# Patient Record
Sex: Male | Born: 1960 | Race: White | Hispanic: No | Marital: Married | Smoking: Never smoker
Health system: Southern US, Community
[De-identification: ages and names within clinical notes are randomized; demographics above are authoritative.]

## PROBLEM LIST (undated history)

## (undated) DIAGNOSIS — E785 Hyperlipidemia, unspecified: Secondary | ICD-10-CM

## (undated) DIAGNOSIS — I10 Essential (primary) hypertension: Secondary | ICD-10-CM

## (undated) HISTORY — DX: Essential (primary) hypertension: I10

## (undated) HISTORY — DX: Hyperlipidemia, unspecified: E78.5

## (undated) HISTORY — PX: FOOT FRACTURE SURGERY: SHX645

---

## 2014-05-09 ENCOUNTER — Emergency Department (HOSPITAL_COMMUNITY): Payer: 59

## 2014-05-09 ENCOUNTER — Emergency Department (HOSPITAL_COMMUNITY)
Admission: EM | Admit: 2014-05-09 | Discharge: 2014-05-09 | Disposition: A | Discharge status: Home / Self Care | Payer: 59 | Attending: Emergency Medicine | Admitting: Emergency Medicine

## 2014-05-09 ENCOUNTER — Encounter (HOSPITAL_COMMUNITY): Payer: Self-pay | Admitting: Emergency Medicine

## 2014-05-09 DIAGNOSIS — Y9389 Activity, other specified: Secondary | ICD-10-CM | POA: Insufficient documentation

## 2014-05-09 DIAGNOSIS — Y92410 Unspecified street and highway as the place of occurrence of the external cause: Secondary | ICD-10-CM | POA: Insufficient documentation

## 2014-05-09 DIAGNOSIS — S99929A Unspecified injury of unspecified foot, initial encounter: Secondary | ICD-10-CM

## 2014-05-09 DIAGNOSIS — S8990XA Unspecified injury of unspecified lower leg, initial encounter: Secondary | ICD-10-CM | POA: Insufficient documentation

## 2014-05-09 DIAGNOSIS — S52022A Displaced fracture of olecranon process without intraarticular extension of left ulna, initial encounter for closed fracture: Secondary | ICD-10-CM

## 2014-05-09 DIAGNOSIS — S52023A Displaced fracture of olecranon process without intraarticular extension of unspecified ulna, initial encounter for closed fracture: Secondary | ICD-10-CM | POA: Insufficient documentation

## 2014-05-09 DIAGNOSIS — S99919A Unspecified injury of unspecified ankle, initial encounter: Secondary | ICD-10-CM

## 2014-05-09 MED ORDER — ONDANSETRON HCL 4 MG/2ML IJ SOLN
4.0000 mg | Freq: Once | INTRAMUSCULAR | Status: AC
  Administered 2014-05-09: 4 mg via INTRAVENOUS
  Filled 2014-05-09: qty 2

## 2014-05-09 MED ORDER — HYDROMORPHONE HCL PF 1 MG/ML IJ SOLN
1.0000 mg | Freq: Once | INTRAMUSCULAR | Status: AC
  Administered 2014-05-09: 1 mg via INTRAVENOUS
  Filled 2014-05-09: qty 1

## 2014-05-09 MED ORDER — OXYCODONE-ACETAMINOPHEN 5-325 MG PO TABS: 2.0000 | ORAL_TABLET | Freq: Four times a day (QID) | ORAL | Status: DC | PRN
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
## 2014-05-09 NOTE — ED Notes (Signed)
Cleaned wounds with normal saline.  Dry non stick dressings applied where needed.

## 2014-05-09 NOTE — Discharge Instructions (Signed)
Take motrin 800 mg every 6 hrs for pain.   For severe pain, take percocet as prescribed. Do NOT drive.  Wear splint at all times.   Follow up with orthopedic doctor in a week for repeat xray.   Return to ER if you have severe pain, fingers turning blue.

## 2014-05-09 NOTE — Progress Notes (Signed)
Orthopedic Tech Progress Note Patient Details:  Ramello Cordial [birth year] [id number] Applied fiberglass posterior arm splint to LUE.  Pulses, sensation, motion intact before and after splinting.  Capillary refill less than 2 seconds before and after splinting.  Applied sling to LUE. Ortho Devices Type of Ortho Device: Arm sling;Post (short arm) splint Ortho Device/Splint Interventions: Application   Darrol Poke 05/09/2014, 9:00 AM

## 2014-05-09 NOTE — ED Notes (Signed)
Bed: WA13 Expected date:  Expected time:  Means of arrival:  Comments: EMS 

## 2014-05-16 ENCOUNTER — Ambulatory Visit
Admission: RE | Admit: 2014-05-16 | Discharge: 2014-05-16 | Disposition: A | Discharge status: Home / Self Care | Payer: 59 | Source: Ambulatory Visit | Attending: Orthopedic Surgery | Admitting: Orthopedic Surgery

## 2014-05-16 ENCOUNTER — Other Ambulatory Visit: Payer: Self-pay | Admitting: Orthopedic Surgery

## 2014-05-16 DIAGNOSIS — S52022A Displaced fracture of olecranon process without intraarticular extension of left ulna, initial encounter for closed fracture: Secondary | ICD-10-CM

## 2016-12-11 DIAGNOSIS — B029 Zoster without complications: Secondary | ICD-10-CM | POA: Diagnosis not present

## 2016-12-11 DIAGNOSIS — H9202 Otalgia, left ear: Secondary | ICD-10-CM | POA: Diagnosis not present

## 2016-12-28 DIAGNOSIS — E7800 Pure hypercholesterolemia, unspecified: Secondary | ICD-10-CM | POA: Diagnosis not present

## 2016-12-28 DIAGNOSIS — Z0000 Encounter for general adult medical examination without abnormal findings: Secondary | ICD-10-CM | POA: Diagnosis not present

## 2017-01-28 DIAGNOSIS — H6123 Impacted cerumen, bilateral: Secondary | ICD-10-CM | POA: Diagnosis not present

## 2017-03-29 DIAGNOSIS — E7800 Pure hypercholesterolemia, unspecified: Secondary | ICD-10-CM | POA: Diagnosis not present

## 2017-04-20 DIAGNOSIS — L237 Allergic contact dermatitis due to plants, except food: Secondary | ICD-10-CM | POA: Diagnosis not present

## 2017-05-04 DIAGNOSIS — H6123 Impacted cerumen, bilateral: Secondary | ICD-10-CM | POA: Diagnosis not present

## 2017-09-21 DIAGNOSIS — H6123 Impacted cerumen, bilateral: Secondary | ICD-10-CM | POA: Diagnosis not present

## 2017-12-01 DIAGNOSIS — J329 Chronic sinusitis, unspecified: Secondary | ICD-10-CM | POA: Diagnosis not present

## 2017-12-27 DIAGNOSIS — H6123 Impacted cerumen, bilateral: Secondary | ICD-10-CM | POA: Diagnosis not present

## 2017-12-27 DIAGNOSIS — J0100 Acute maxillary sinusitis, unspecified: Secondary | ICD-10-CM | POA: Diagnosis not present

## 2017-12-27 DIAGNOSIS — J302 Other seasonal allergic rhinitis: Secondary | ICD-10-CM | POA: Diagnosis not present

## 2018-04-12 DIAGNOSIS — H6123 Impacted cerumen, bilateral: Secondary | ICD-10-CM | POA: Diagnosis not present

## 2018-07-05 DIAGNOSIS — I10 Essential (primary) hypertension: Secondary | ICD-10-CM | POA: Diagnosis not present

## 2018-09-15 DIAGNOSIS — H6123 Impacted cerumen, bilateral: Secondary | ICD-10-CM | POA: Diagnosis not present

## 2020-05-23 ENCOUNTER — Other Ambulatory Visit: Payer: Self-pay | Admitting: Internal Medicine

## 2020-05-23 DIAGNOSIS — H905 Unspecified sensorineural hearing loss: Secondary | ICD-10-CM

## 2020-05-23 DIAGNOSIS — H8123 Vestibular neuronitis, bilateral: Secondary | ICD-10-CM

## 2020-05-23 DIAGNOSIS — R2689 Other abnormalities of gait and mobility: Secondary | ICD-10-CM

## 2020-05-23 DIAGNOSIS — H9313 Tinnitus, bilateral: Secondary | ICD-10-CM

## 2020-06-07 ENCOUNTER — Other Ambulatory Visit: Payer: Self-pay

## 2020-06-25 ENCOUNTER — Ambulatory Visit
Admission: RE | Admit: 2020-06-25 | Discharge: 2020-06-25 | Disposition: A | Discharge status: Home / Self Care | Payer: No Typology Code available for payment source | Source: Ambulatory Visit | Attending: Internal Medicine | Admitting: Internal Medicine

## 2020-06-25 ENCOUNTER — Other Ambulatory Visit: Payer: Self-pay

## 2020-06-25 DIAGNOSIS — H9313 Tinnitus, bilateral: Secondary | ICD-10-CM

## 2020-06-25 DIAGNOSIS — H8123 Vestibular neuronitis, bilateral: Secondary | ICD-10-CM

## 2020-06-25 DIAGNOSIS — R2689 Other abnormalities of gait and mobility: Secondary | ICD-10-CM

## 2020-06-25 DIAGNOSIS — H905 Unspecified sensorineural hearing loss: Secondary | ICD-10-CM

## 2020-06-25 MED ORDER — GADOBENATE DIMEGLUMINE 529 MG/ML IV SOLN
15.0000 mL | Freq: Once | INTRAVENOUS | Status: AC | PRN
Start: 2020-06-25 — End: 2020-06-25
  Administered 2020-06-25: 15 mL via INTRAVENOUS

## 2020-09-24 ENCOUNTER — Ambulatory Visit: Payer: Self-pay | Admitting: Neurology

## 2021-01-02 ENCOUNTER — Other Ambulatory Visit: Payer: Self-pay | Admitting: Physician Assistant

## 2021-01-02 DIAGNOSIS — D329 Benign neoplasm of meninges, unspecified: Secondary | ICD-10-CM

## 2021-12-26 ENCOUNTER — Other Ambulatory Visit (HOSPITAL_COMMUNITY): Payer: Self-pay | Admitting: Family Medicine

## 2022-01-08 ENCOUNTER — Other Ambulatory Visit: Payer: Self-pay

## 2022-01-08 ENCOUNTER — Ambulatory Visit (HOSPITAL_COMMUNITY)
Admission: RE | Admit: 2022-01-08 | Discharge: 2022-01-08 | Disposition: A | Discharge status: Home / Self Care | Payer: Self-pay | Source: Ambulatory Visit | Attending: Family Medicine | Admitting: Family Medicine

## 2022-01-08 DIAGNOSIS — R911 Solitary pulmonary nodule: Secondary | ICD-10-CM | POA: Insufficient documentation

## 2022-01-08 DIAGNOSIS — Z136 Encounter for screening for cardiovascular disorders: Secondary | ICD-10-CM | POA: Insufficient documentation

## 2022-02-21 NOTE — Progress Notes (Signed)
?  ?Cardiology Office Note ? ? ?Date:  02/26/2022  ? ?ID:  Wesley Rosales, DOB 01-28-1961, MRN [id number] ? ?PCP:  Wesley Pepper, MD  ?Cardiologist:   Wesley Golob Martinique, MD  ? ?Chief Complaint  ?Patient presents with  ? Coronary Artery Disease  ? ? ?  ?History of Present Illness: ?Wesley Rosales is a 61 y.o. male who is seen at the request of Dr Wesley Rosales for evaluation of an abnormal coronary calcium score. His wife Wesley Rosales used to be my Education administrator. He works in Engineer, technical sales. States he is sedentary. Had coronary calcium score done recently for risk assessment. Had a brother with a coronary stent age 62. He has a history of HTN and HLD. Recently started on statin 3 weeks ago. He denies any chest pain. Does get SOB when running. Generally feels well.  ? ?Past Medical History:  ?Diagnosis Date  ? Hyperlipidemia   ? Hypertension   ? ? ?Past Surgical History:  ?Procedure Laterality Date  ? FOOT FRACTURE SURGERY    ? ? ? ?Current Outpatient Medications  ?Medication Sig Dispense Refill  ? aspirin EC 81 MG tablet Take 1 tablet (81 mg total) by mouth daily. Swallow whole. 90 tablet 3  ? atorvastatin (LIPITOR) 10 MG tablet Take 10 mg by mouth daily.    ? FLUoxetine (PROZAC) 20 MG tablet Take 20 mg by mouth daily.    ? fluticasone (FLONASE) 50 MCG/ACT nasal spray 1 spray in each nostril    ? ibuprofen (ADVIL,MOTRIN) 200 MG tablet Take 600 mg by mouth every 6 (six) hours as needed for moderate pain.    ? lisinopril (ZESTRIL) 10 MG tablet Take 1 tablet by mouth daily.    ? ?No current facility-administered medications for this visit.  ? ? ?Allergies:   Patient has no known allergies.  ? ? ?Social History:  The patient  reports that he has never smoked. He does not have any smokeless tobacco history on file. He reports current alcohol use. He reports that he does not use drugs.  ? ?Family History:  The patient's family history includes COPD in his father; Heart disease in his brother; Lung cancer in his mother; Stroke in his mother.  ? ? ?ROS:   Please see the history of present illness.   Otherwise, review of systems are positive for none.   All other systems are reviewed and negative.  ? ? ?PHYSICAL EXAM: ?VS:  BP 132/86   Pulse 67   Ht 6' (1.829 m)   Wt 202 lb 12.8 oz (92 kg)   SpO2 98%   BMI 27.50 kg/m?  , BMI Body mass index is 27.5 kg/m?. ?GEN: Well nourished, well developed, in no acute distress ?HEENT: normal ?Neck: no JVD, carotid bruits, or masses ?Cardiac: RRR; no murmurs, rubs, or gallops,no edema  ?Respiratory:  clear to auscultation bilaterally, normal work of breathing ?GI: soft, nontender, nondistended, + BS ?MS: no deformity or atrophy ?Skin: warm and dry, no rash ?Neuro:  Strength and sensation are intact ?Psych: euthymic mood, full affect ? ? ?EKG:  EKG is ordered today. ?The ekg ordered today demonstrates NSR rate 67. Normal Ecg. I have personally reviewed and interpreted this study. ? ? ? ?Recent Labs: ?No results found for requested labs within last 8760 hours.  ? ?Dated 04/21/19: cholesterol 192, triglycerides 162, HDL 37, LDL 123.  ?Dated 05/02/20: Normal CBC, CMET, TSH. ? ?Lipid Panel ?No results found for: CHOL, TRIG, HDL, CHOLHDL, VLDL, LDLCALC, LDLDIRECT ?  ? ?Wt Readings  from Last 3 Encounters:  ?02/26/22 202 lb 12.8 oz (92 kg)  ?  ? ? ?Other studies Reviewed: ?Additional studies/ records that were reviewed today include:  ? ?ADDENDUM REPORT: 01/08/2022 16:22 ?  ?EXAM: ?OVER-READ INTERPRETATION  CT CHEST ?  ?The following report is an over-read performed by radiologist Dr. ?[facility] on 01/08/2022. This over-read ?does not include interpretation of cardiac or coronary anatomy or ?pathology. The coronary calcium score interpretation by the ?cardiologist is attached. ?  ?COMPARISON:  None. ?  ?FINDINGS: ?Visualized mediastinal structures are normal. Images of the upper ?abdomen are unremarkable. 3 mm nodule in the right middle lobe on ?sequence 5 image 24. No large areas of consolidation or  airspace ?disease in the visualized lungs. No large pleural effusions. No ?acute bone abnormality. ?  ?IMPRESSION: ?1. No acute extracardiac findings. ?2. 3 mm nodule in the right middle lobe. No follow-up needed if ?patient is low-risk (and has no known or suspected primary ?neoplasm). Non-contrast chest CT can be considered in 12 months if ?patient is high-risk. This recommendation follows the consensus ?statement: Guidelines for Management of Incidental Pulmonary Nodules ?Detected on CT Images: From the Fleischner Society 2017; Radiology ?2017; 093:[id number]. ?  ?  ?Electronically Signed ?  By: Markus Daft M.D. ?  On: 01/08/2022 16:22 ?   ?Addended by Markus Daft, MD on 01/08/2022  4:25 PM  ? ?Study Result ? ?Narrative & Impression  ?CLINICAL DATA:  Cardiovascular Disease Risk stratification ?  ?EXAM: ?Coronary Calcium Score ?  ?TECHNIQUE: ?A gated, non-contrast computed tomography scan of the heart was ?performed using 30m slice thickness. Axial images were analyzed on a ?dedicated workstation. Calcium scoring of the coronary arteries was ?performed using the Agatston method. ?  ?FINDINGS: ?Coronary arteries: Normal origins. ?  ?Coronary Calcium Score: ?  ?Left main: 0 ?  ?Left anterior descending artery: 16.6 ?  ?Left circumflex artery: 0 ?  ?Right coronary artery: 228 ?  ?Total: 244 ?  ?Percentile: 85th ?  ?Pericardium: Normal. ?  ?Ascending Aorta: Normal caliber. ?  ?[facility]card[facility]: [facility] ?  ?IMPRESSION: ?Coronary calcium score of 244. This was 85th percentile for age-, ?race-, and sex-matched controls.  ? ? ? ?ASSESSMENT AND PLAN: ? ?1.  Coronary artery disease with elevated coronary calcium score. Only symptom may be DOE but this is more likely related to deconditioning. Discussed lifestyle modification moving to more of a plant based/Mediterranean style diet. Needs regular aerobic exercise 30-45 minutes per day. Recommend he take ASA 81 mg daily. Continue to focus on BP and  lipid control. Will arrange for ETT to make sure he doesn't have high risk anatomy ?2. Hypercholesterolemia. Goal LDL < 70. To have follow up lab with PCP  ?3. HTN on lisinopril ?4. Family history of CAD ? ? ?Current medicines are reviewed at length with the patient today.  The patient does not have concerns regarding medicines. ? ?The following changes have been made:  add ASA 81 mg daily ? ?Labs/ tests ordered today include:  ? ?Orders Placed This Encounter  ?Procedures  ? Cardiac Stress Test: Informed Consent Details: Physician/Practitioner Attestation; Transcribe to consent form and obtain patient signature  ? EKG 12-Lead  ? ? ?   ? ? ?Disposition:   FU with me in 1 year providing ETT is OK ? ?Signed, ?Saber Dickerman JMartinique MD  ?02/26/2022 9:03 AM    ?[place] [place]?[address] [place] [address]?Phone [phone] Fax  [phone] ? ? ?

## 2022-02-26 ENCOUNTER — Encounter: Payer: Self-pay | Admitting: Cardiology

## 2022-02-26 ENCOUNTER — Ambulatory Visit (INDEPENDENT_AMBULATORY_CARE_PROVIDER_SITE_OTHER): Payer: No Typology Code available for payment source | Admitting: Cardiology

## 2022-02-26 VITALS — BP 132/86 | HR 67 | Ht 72.0 in | Wt 202.8 lb

## 2022-02-26 DIAGNOSIS — E7800 Pure hypercholesterolemia, unspecified: Secondary | ICD-10-CM

## 2022-02-26 DIAGNOSIS — I10 Essential (primary) hypertension: Secondary | ICD-10-CM | POA: Diagnosis not present

## 2022-02-26 DIAGNOSIS — I2510 Atherosclerotic heart disease of native coronary artery without angina pectoris: Secondary | ICD-10-CM

## 2022-02-26 MED ORDER — ASPIRIN EC 81 MG PO TBEC: 81.0000 mg | DELAYED_RELEASE_TABLET | Freq: Every day | ORAL | 3 refills | Status: AC

## 2022-02-26 NOTE — Patient Instructions (Signed)
Medication Instructions:  ?Continue same medications ? ? ?Lab Work: ?None ordered ? ? ?Testing/Procedures: ?Treadmill  ( POET ) ? ? ?Follow-Up: ?At [facility], you and your health needs are our priority.  As part of our continuing mission to provide you with exceptional heart care, we have created designated Provider Care Teams.  These Care Teams include your primary Cardiologist (physician) and Advanced Practice Providers (APPs -  Physician Assistants and Nurse Practitioners) who all work together to provide you with the care you need, when you need it. ? ?We recommend signing up for the patient portal called "MyChart".  Sign up information is provided on this After Visit Summary.  MyChart is used to connect with patients for Virtual Visits (Telemedicine).  Patients are able to view lab/test results, encounter notes, upcoming appointments, etc.  Non-urgent messages can be sent to your provider as well.   ?To learn more about what you can do with MyChart, go to [email or web address].   ? ?Your next appointment:  1 year   Call in Jan to schedule April appointment ?  ? ?The format for your next appointment: Office ? ? ?Provider:  Dr.Jordan ? ? ?Important Information About Sugar ? ? ? ? ? ? ?

## 2022-03-05 ENCOUNTER — Telehealth (HOSPITAL_COMMUNITY): Payer: Self-pay | Admitting: *Deleted

## 2022-03-05 NOTE — Telephone Encounter (Signed)
Close encounter 

## 2022-03-06 ENCOUNTER — Ambulatory Visit (HOSPITAL_COMMUNITY)
Admission: RE | Admit: 2022-03-06 | Discharge: 2022-03-06 | Disposition: A | Discharge status: Home / Self Care | Payer: No Typology Code available for payment source | Source: Ambulatory Visit | Attending: Cardiology | Admitting: Cardiology

## 2022-03-06 DIAGNOSIS — I10 Essential (primary) hypertension: Secondary | ICD-10-CM

## 2022-03-06 DIAGNOSIS — E7800 Pure hypercholesterolemia, unspecified: Secondary | ICD-10-CM

## 2022-03-06 DIAGNOSIS — I2510 Atherosclerotic heart disease of native coronary artery without angina pectoris: Secondary | ICD-10-CM | POA: Diagnosis present

## 2022-03-06 LAB — EXERCISE TOLERANCE TEST
Angina Index: 0
Duke Treadmill Score: 9
Estimated workload: 10.2
Exercise duration (min): 9 min
Exercise duration (sec): 12 s
MPHR: 162 {beats}/min
Peak HR: 146 {beats}/min
Percent HR: 90 %
Rest HR: 60 {beats}/min
ST Depression (mm): 0 mm

## 2023-12-16 IMAGING — CT CT CARDIAC CORONARY ARTERY CALCIUM SCORE
2 series · 14 of 20 positions shown, 16 images · non-contrast
Comparison: None.

Addendum:
CLINICAL DATA: Cardiovascular Disease Risk stratification

EXAM:
Coronary Calcium Score
TECHNIQUE: A gated, non-contrast computed tomography scan of the heart was
performed using 3mm slice thickness. Axial images were analyzed on a
dedicated workstation. Calcium scoring of the coronary arteries was
performed using the Agatston method.

[Series 3: cascseq 2.0 b35f 70% · axial · 0.36mm/px · z∈[+1260,+1336]mm · 6 of 69 slices shown]
[im 8/69  vessel]
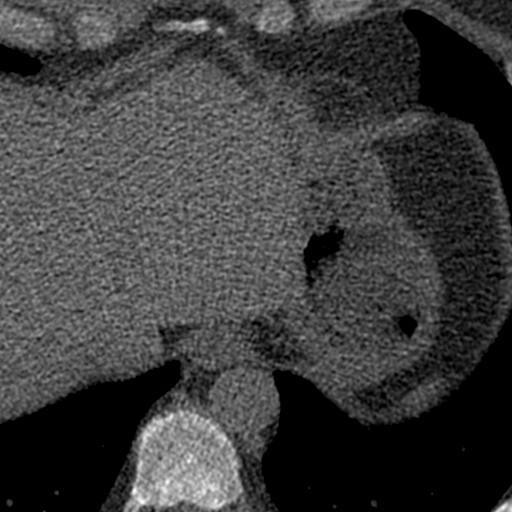
[im 16/69  vessel]
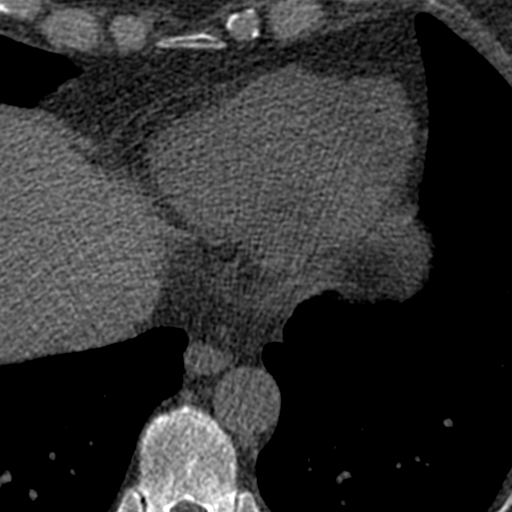
[im 23/69  vessel]
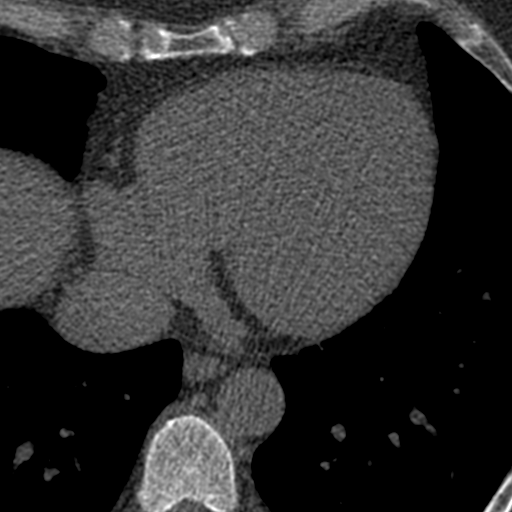
[im 31/69  vessel]
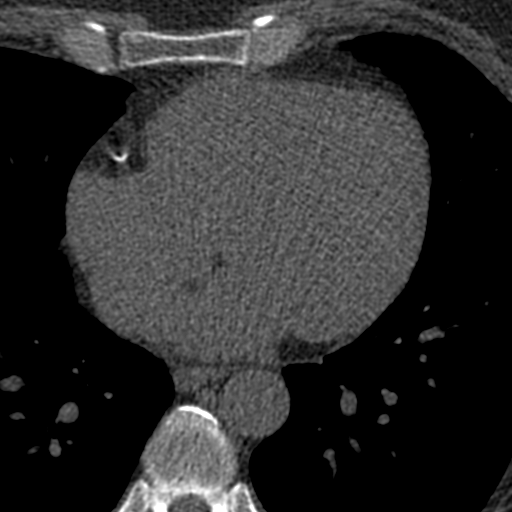
[im 38/69  vessel]
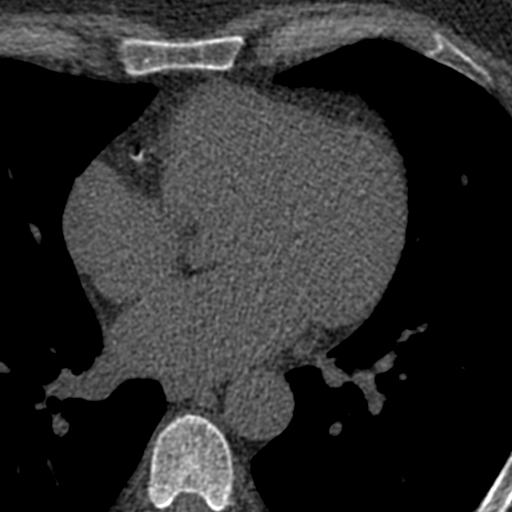
[im 46/69  vessel]
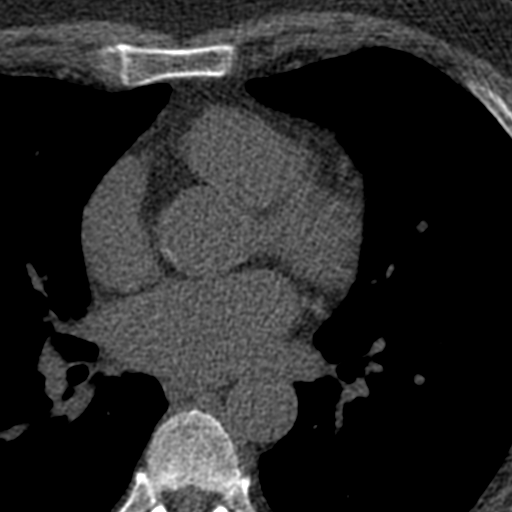

[Series 4: ax st full fov · axial · 0.87mm/px · z∈[+1260,+1366]mm · 8 of 69 slices shown, 10 images]
[im 8/69  vessel]
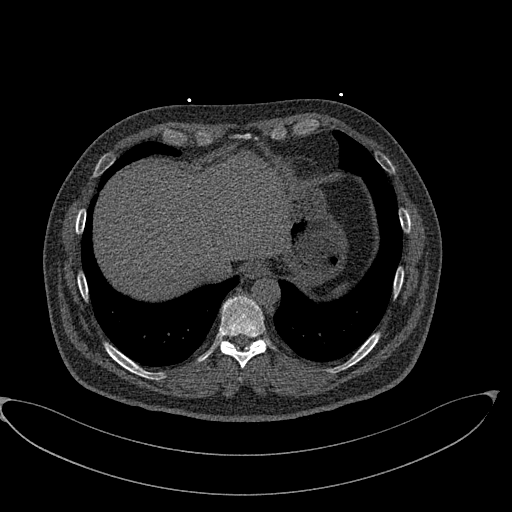
[im 8/69  lung]
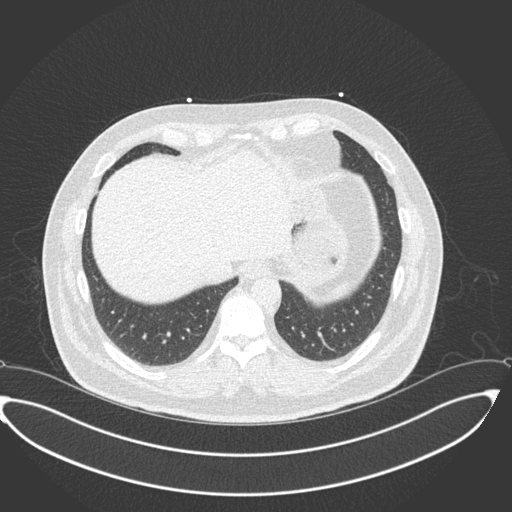
[im 16/69  vessel]
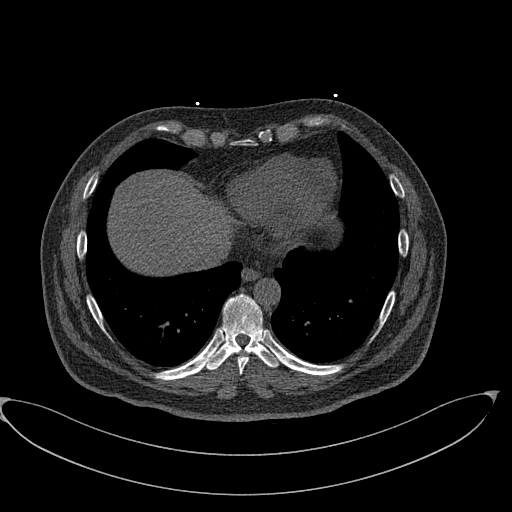
[im 23/69  vessel]
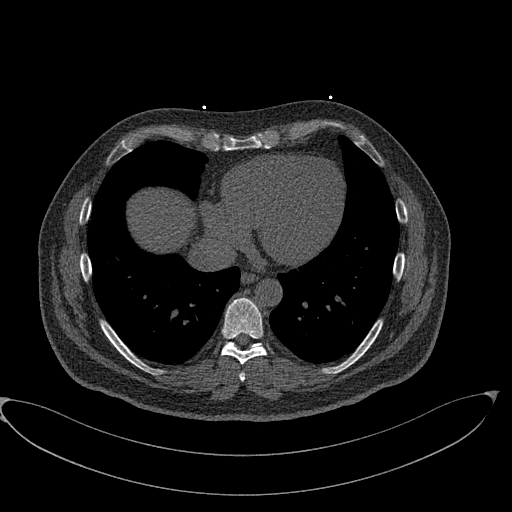
[im 31/69  vessel]
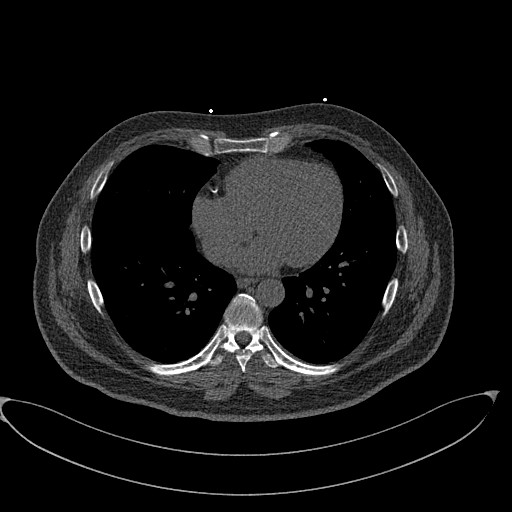
[im 38/69  vessel]
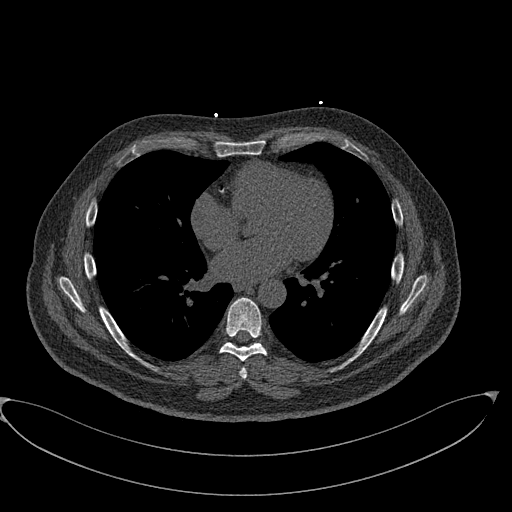
[im 38/69  lung]
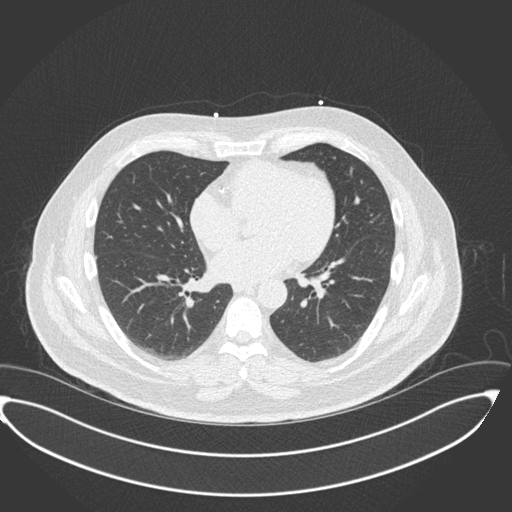
[im 46/69  vessel]
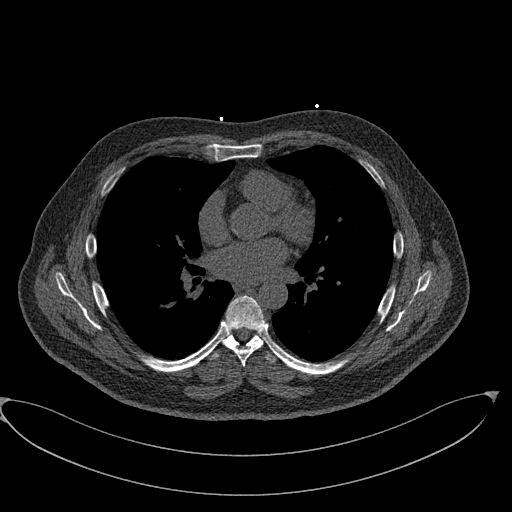
[im 53/69  vessel]
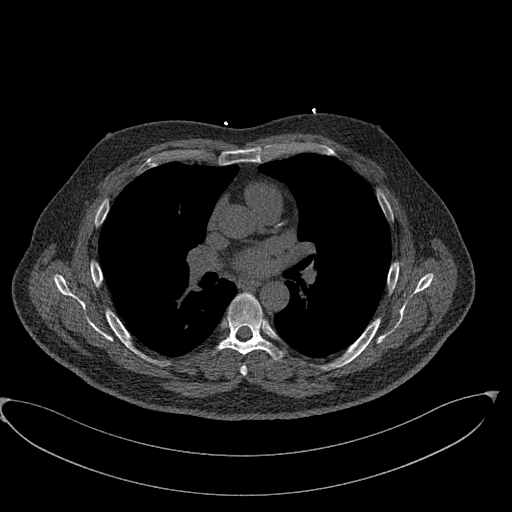
[im 61/69  vessel]
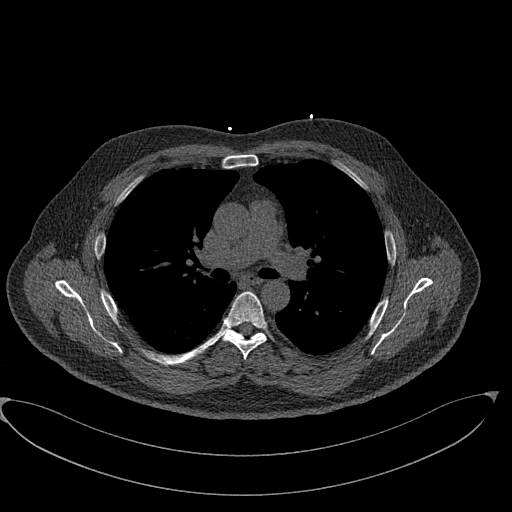

[14 of 20 positions shown; findings below may reference images not displayed]

FINDINGS: Coronary arteries: Normal origins.

Coronary Calcium Score:

Left main: 0

Left anterior descending artery:

Left circumflex artery: 0

Right coronary artery: 228

Total: 244

Percentile: 85th

Pericardium: Normal.

Ascending Aorta: Normal caliber.

Non-cardiac: See separate report from [REDACTED].
IMPRESSION: Coronary calcium score of 244. This was 85th percentile for age-,
race-, and sex-matched controls.



If CAC=0, it is reasonable to withhold statin therapy and reassess
in 5 to 10 years, as long as higher risk conditions are absent
(diabetes mellitus, family history of premature CHD in first degree
relatives (males <55 years; females <65 years), cigarette smoking,
or LDL >=190 mg/dL).

If CAC is 1 to 99, it is reasonable to initiate statin therapy for
patients >=55 years of age.

If CAC is >=100 or >=75th percentile, it is reasonable to initiate
statin therapy at any age.

Cardiology referral should be considered for patients with CAC
scores >=400 or >=75th percentile.

*0863 AHA/ACC/AACVPR/AAPA/ABC/ONO/FLEDRICH/AIDAI/Florence Adwoa/NEHA/MAYABEL/AULTMAN
Guideline on the Management of Blood Cholesterol: A Report of the
American College of Cardiology/American Heart Association Task Force
on Clinical Practice Guidelines. J Am Coll Cardiol.
3542;73(24):6258-6230.

EXAM:
OVER-READ INTERPRETATION  CT CHEST

The following report is an over-read performed by radiologist Dr.
does not include interpretation of cardiac or coronary anatomy or
pathology. The coronary calcium score interpretation by the
cardiologist is attached.
FINDINGS: Visualized mediastinal structures are normal. Images of the upper
abdomen are unremarkable. 3 mm nodule in the right middle lobe on
sequence 5 image 24. No large areas of consolidation or airspace
disease in the visualized lungs. No large pleural effusions. No
acute bone abnormality.
IMPRESSION: 1. No acute extracardiac findings.
2. 3 mm nodule in the right middle lobe. No follow-up needed if
patient is low-risk (and has no known or suspected primary
neoplasm). Non-contrast chest CT can be considered in 12 months if
patient is high-risk. This recommendation follows the consensus
statement: Guidelines for Management of Incidental Pulmonary Nodules
Detected on CT Images: From the [HOSPITAL] 6075; Radiology
6075; [DATE].

*** End of Addendum ***
FINDINGS: Coronary arteries: Normal origins.

Coronary Calcium Score:

Left main: 0

Left anterior descending artery:

Left circumflex artery: 0

Right coronary artery: 228

Total: 244

Percentile: 85th

Pericardium: Normal.

Ascending Aorta: Normal caliber.

Non-cardiac: See separate report from [REDACTED].
IMPRESSION: Coronary calcium score of 244. This was 85th percentile for age-,
race-, and sex-matched controls.



If CAC=0, it is reasonable to withhold statin therapy and reassess
in 5 to 10 years, as long as higher risk conditions are absent
(diabetes mellitus, family history of premature CHD in first degree
relatives (males <55 years; females <65 years), cigarette smoking,
or LDL >=190 mg/dL).

If CAC is 1 to 99, it is reasonable to initiate statin therapy for
patients >=55 years of age.

If CAC is >=100 or >=75th percentile, it is reasonable to initiate
statin therapy at any age.

Cardiology referral should be considered for patients with CAC
scores >=400 or >=75th percentile.

*0863 AHA/ACC/AACVPR/AAPA/ABC/ONO/FLEDRICH/AIDAI/Florence Adwoa/NEHA/MAYABEL/AULTMAN
Guideline on the Management of Blood Cholesterol: A Report of the
American College of Cardiology/American Heart Association Task Force
on Clinical Practice Guidelines. J Am Coll Cardiol.
3542;73(24):6258-6230.
